# Patient Record
Sex: Male | Born: 1990 | Race: White | Hispanic: No | Marital: Single | Smoking: Former smoker
Health system: Southern US, Community
[De-identification: ages and names within clinical notes are randomized; demographics above are authoritative.]

## PROBLEM LIST (undated history)

## (undated) DIAGNOSIS — R569 Unspecified convulsions: Secondary | ICD-10-CM

## (undated) DIAGNOSIS — Z87828 Personal history of other (healed) physical injury and trauma: Secondary | ICD-10-CM

## (undated) HISTORY — PX: CRANIECTOMY FOR DEPRESSED SKULL FRACTURE: SHX5788

---

## 2015-05-27 ENCOUNTER — Emergency Department (HOSPITAL_COMMUNITY): Payer: Self-pay

## 2015-05-27 ENCOUNTER — Emergency Department (HOSPITAL_COMMUNITY)
Admission: EM | Admit: 2015-05-27 | Discharge: 2015-05-27 | Disposition: A | Discharge status: Home / Self Care | Payer: Self-pay | Attending: Emergency Medicine | Admitting: Emergency Medicine

## 2015-05-27 ENCOUNTER — Encounter (HOSPITAL_COMMUNITY): Payer: Self-pay | Admitting: *Deleted

## 2015-05-27 DIAGNOSIS — R569 Unspecified convulsions: Secondary | ICD-10-CM

## 2015-05-27 DIAGNOSIS — G40909 Epilepsy, unspecified, not intractable, without status epilepticus: Secondary | ICD-10-CM | POA: Insufficient documentation

## 2015-05-27 DIAGNOSIS — Z7982 Long term (current) use of aspirin: Secondary | ICD-10-CM | POA: Insufficient documentation

## 2015-05-27 DIAGNOSIS — Z3202 Encounter for pregnancy test, result negative: Secondary | ICD-10-CM | POA: Insufficient documentation

## 2015-05-27 DIAGNOSIS — Z87891 Personal history of nicotine dependence: Secondary | ICD-10-CM | POA: Insufficient documentation

## 2015-05-27 DIAGNOSIS — Z880 Allergy status to penicillin: Secondary | ICD-10-CM | POA: Insufficient documentation

## 2015-05-27 HISTORY — DX: Unspecified convulsions: R56.9

## 2015-05-27 LAB — CBC WITH DIFFERENTIAL/PLATELET
BASOS ABS: 0 10*3/uL (ref 0.0–0.1)
Basophils Relative: 0 % (ref 0–1)
EOS ABS: 0.1 10*3/uL (ref 0.0–0.7)
EOS PCT: 1 % (ref 0–5)
HEMATOCRIT: 35.1 % — AB (ref 36.0–46.0)
HEMOGLOBIN: 11.8 g/dL — AB (ref 12.0–15.0)
Lymphocytes Relative: 20 % (ref 12–46)
Lymphs Abs: 1.2 10*3/uL (ref 0.7–4.0)
MCH: 30.3 pg (ref 26.0–34.0)
MCHC: 33.6 g/dL (ref 30.0–36.0)
MCV: 90 fL (ref 78.0–100.0)
MONO ABS: 0.4 10*3/uL (ref 0.1–1.0)
MONOS PCT: 7 % (ref 3–12)
NEUTROS PCT: 72 % (ref 43–77)
Neutro Abs: 4.3 10*3/uL (ref 1.7–7.7)
Platelets: 133 10*3/uL — ABNORMAL LOW (ref 150–400)
RBC: 3.9 MIL/uL (ref 3.87–5.11)
RDW: 12.4 % (ref 11.5–15.5)
WBC: 5.9 10*3/uL (ref 4.0–10.5)

## 2015-05-27 LAB — BASIC METABOLIC PANEL
Anion gap: 6 (ref 5–15)
BUN: 7 mg/dL (ref 6–20)
CALCIUM: 9.1 mg/dL (ref 8.9–10.3)
CO2: 25 mmol/L (ref 22–32)
CREATININE: 0.67 mg/dL (ref 0.44–1.00)
Chloride: 107 mmol/L (ref 101–111)
GFR calc non Af Amer: >60 mL/min (ref >60)
GLUCOSE: 66 mg/dL (ref 65–99)
Potassium: 3.7 mmol/L (ref 3.5–5.1)
Sodium: 138 mmol/L (ref 135–145)

## 2015-05-27 LAB — URINALYSIS, ROUTINE W REFLEX MICROSCOPIC
Bilirubin Urine: NEGATIVE
GLUCOSE, UA: NEGATIVE mg/dL
KETONES UR: NEGATIVE mg/dL
NITRITE: NEGATIVE
PH: 6 (ref 5.0–8.0)
Protein, ur: NEGATIVE mg/dL
SPECIFIC GRAVITY, URINE: 1.029 (ref 1.005–1.030)
Urobilinogen, UA: 0.2 mg/dL (ref 0.0–1.0)

## 2015-05-27 LAB — URINE MICROSCOPIC-ADD ON

## 2015-05-27 LAB — PREGNANCY, URINE: Preg Test, Ur: NEGATIVE

## 2015-05-27 MED ORDER — CARBAMAZEPINE 200 MG PO TABS
200.0000 mg | ORAL_TABLET | Freq: Once | ORAL | Status: AC
  Administered 2015-05-27: 200 mg via ORAL
  Filled 2015-05-27: qty 1

## 2015-05-27 MED ORDER — LEVETIRACETAM 500 MG PO TABS: 500.0000 mg | ORAL_TABLET | Freq: Two times a day (BID) | ORAL | Status: DC

## 2015-05-27 MED ORDER — ACETAMINOPHEN 325 MG PO TABS
650.0000 mg | ORAL_TABLET | Freq: Once | ORAL | Status: AC
  Administered 2015-05-27: 650 mg via ORAL
  Filled 2015-05-27: qty 2

## 2015-05-27 MED ORDER — CARBAMAZEPINE 200 MG PO TABS: ORAL_TABLET | ORAL | Status: AC

## 2015-05-27 MED ORDER — LEVETIRACETAM 500 MG PO TABS
500.0000 mg | ORAL_TABLET | Freq: Once | ORAL | Status: DC
  Filled 2015-05-27: qty 1
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
## 2015-05-27 NOTE — Discharge Instructions (Signed)
You were evaluated in the ED today for your seizures. Your labs, exam and CT scan were all reassuring. It is important for you to follow-up with neurology for reevaluation for this problem. It is also important that you do not drive cars, take baths or swimming pool, cook alone or do any activities alone that could cause harm if you do have a seizure. Return to ED for worsening symptoms.  Seizure, Adult A seizure is abnormal electrical activity in the brain. Seizures usually last from 30 seconds to 2 minutes. There are various types of seizures. Before a seizure, you may have a warning sensation (aura) that a seizure is about to occur. An aura may include the following symptoms:   Fear or anxiety.  Nausea.  Feeling like the room is spinning (vertigo).  Vision changes, such as seeing flashing lights or spots. Common symptoms during a seizure include:  A change in attention or behavior (altered mental status).  Convulsions with rhythmic jerking movements.  Drooling.  Rapid eye movements.  Grunting.  Loss of bladder and bowel control.  Bitter taste in the mouth.  Tongue biting. After a seizure, you may feel confused and sleepy. You may also have an injury resulting from convulsions during the seizure. HOME CARE INSTRUCTIONS   If you are given medicines, take them exactly as prescribed by your health care provider.  Keep all follow-up appointments as directed by your health care provider.  Do not swim or drive or engage in risky activity during which a seizure could cause further injury to you or others until your health care provider says it is OK.  Get adequate rest.  Teach friends and family what to do if you have a seizure. They should:  Lay you on the ground to prevent a fall.  Put a cushion under your head.  Loosen any tight clothing around your neck.  Turn you on your side. If vomiting occurs, this helps keep your airway clear.  Stay with you until you  recover.  Know whether or not you need emergency care. SEEK IMMEDIATE MEDICAL CARE IF:  The seizure lasts longer than 5 minutes.  The seizure is severe or you do not wake up immediately after the seizure.  You have an altered mental status after the seizure.  You are having more frequent or worsening seizures. Someone should drive you to the emergency department or call local emergency services (911 in [place] MAKE SURE YOU:  Understand these instructions.  Will watch your condition.  Will get help right away if you are not doing well or get worse. Document Released: 09/10/2000 Document Revised: 07/04/2013 Document Reviewed: 04/25/2013 [facility] Patient Information 2015 [place], [place]. This information is not intended to replace advice given to you by your health care provider. Make sure you discuss any questions you have with your health care provider.

## 2015-09-26 ENCOUNTER — Emergency Department (HOSPITAL_COMMUNITY)
Admission: EM | Admit: 2015-09-26 | Discharge: 2015-09-26 | Disposition: A | Discharge status: Home / Self Care | Payer: Self-pay | Attending: Emergency Medicine | Admitting: Emergency Medicine

## 2015-09-26 ENCOUNTER — Emergency Department (HOSPITAL_COMMUNITY): Payer: Self-pay

## 2015-09-26 ENCOUNTER — Encounter (HOSPITAL_COMMUNITY): Payer: Self-pay | Admitting: Emergency Medicine

## 2015-09-26 DIAGNOSIS — R569 Unspecified convulsions: Secondary | ICD-10-CM

## 2015-09-26 DIAGNOSIS — R51 Headache: Secondary | ICD-10-CM | POA: Insufficient documentation

## 2015-09-26 DIAGNOSIS — Z880 Allergy status to penicillin: Secondary | ICD-10-CM | POA: Insufficient documentation

## 2015-09-26 DIAGNOSIS — Z3202 Encounter for pregnancy test, result negative: Secondary | ICD-10-CM | POA: Insufficient documentation

## 2015-09-26 DIAGNOSIS — F1210 Cannabis abuse, uncomplicated: Secondary | ICD-10-CM | POA: Insufficient documentation

## 2015-09-26 DIAGNOSIS — Z7982 Long term (current) use of aspirin: Secondary | ICD-10-CM | POA: Insufficient documentation

## 2015-09-26 DIAGNOSIS — Z79899 Other long term (current) drug therapy: Secondary | ICD-10-CM | POA: Insufficient documentation

## 2015-09-26 DIAGNOSIS — F1910 Other psychoactive substance abuse, uncomplicated: Secondary | ICD-10-CM | POA: Insufficient documentation

## 2015-09-26 DIAGNOSIS — Z87891 Personal history of nicotine dependence: Secondary | ICD-10-CM | POA: Insufficient documentation

## 2015-09-26 DIAGNOSIS — Z87828 Personal history of other (healed) physical injury and trauma: Secondary | ICD-10-CM | POA: Insufficient documentation

## 2015-09-26 DIAGNOSIS — G40901 Epilepsy, unspecified, not intractable, with status epilepticus: Secondary | ICD-10-CM | POA: Insufficient documentation

## 2015-09-26 LAB — CBC WITH DIFFERENTIAL/PLATELET
BASOS ABS: 0 10*3/uL (ref 0.0–0.1)
Basophils Relative: 0 %
EOS PCT: 1 %
Eosinophils Absolute: 0.1 10*3/uL (ref 0.0–0.7)
HCT: 40.5 % (ref 36.0–46.0)
Hemoglobin: 13.9 g/dL (ref 12.0–15.0)
Lymphocytes Relative: 17 %
Lymphs Abs: 1.3 10*3/uL (ref 0.7–4.0)
MCH: 30.6 pg (ref 26.0–34.0)
MCHC: 34.3 g/dL (ref 30.0–36.0)
MCV: 89.2 fL (ref 78.0–100.0)
MONO ABS: 0.5 10*3/uL (ref 0.1–1.0)
MONOS PCT: 7 %
NEUTROS ABS: 5.8 10*3/uL (ref 1.7–7.7)
NEUTROS PCT: 75 %
PLATELETS: 198 10*3/uL (ref 150–400)
RBC: 4.54 MIL/uL (ref 3.87–5.11)
RDW: 12.2 % (ref 11.5–15.5)
WBC: 7.7 10*3/uL (ref 4.0–10.5)

## 2015-09-26 LAB — RAPID URINE DRUG SCREEN, HOSP PERFORMED
Amphetamines: NOT DETECTED
BARBITURATES: NOT DETECTED
Benzodiazepines: POSITIVE — AB
Cocaine: NOT DETECTED
Opiates: NOT DETECTED
Tetrahydrocannabinol: POSITIVE — AB

## 2015-09-26 LAB — URINALYSIS, ROUTINE W REFLEX MICROSCOPIC
BILIRUBIN URINE: NEGATIVE
GLUCOSE, UA: NEGATIVE mg/dL
HGB URINE DIPSTICK: NEGATIVE
KETONES UR: NEGATIVE mg/dL
Nitrite: NEGATIVE
PH: 7 (ref 5.0–8.0)
PROTEIN: NEGATIVE mg/dL
Specific Gravity, Urine: 1.006 (ref 1.005–1.030)

## 2015-09-26 LAB — BASIC METABOLIC PANEL
ANION GAP: 9 (ref 5–15)
CALCIUM: 9.3 mg/dL (ref 8.9–10.3)
CO2: 26 mmol/L (ref 22–32)
CREATININE: 0.58 mg/dL (ref 0.44–1.00)
Chloride: 105 mmol/L (ref 101–111)
GFR calc Af Amer: >60 mL/min (ref >60)
GLUCOSE: 92 mg/dL (ref 65–99)
Potassium: 3.9 mmol/L (ref 3.5–5.1)
Sodium: 140 mmol/L (ref 135–145)

## 2015-09-26 LAB — URINE MICROSCOPIC-ADD ON: RBC / HPF: NONE SEEN RBC/hpf (ref 0–5)

## 2015-09-26 LAB — I-STAT BETA HCG BLOOD, ED (MC, WL, AP ONLY)

## 2015-09-26 LAB — CBG MONITORING, ED: Glucose-Capillary: 89 mg/dL (ref 65–99)

## 2015-09-26 LAB — CARBAMAZEPINE LEVEL, TOTAL

## 2015-09-26 MED ORDER — LORAZEPAM 2 MG/ML IJ SOLN
INTRAMUSCULAR | Status: AC
  Filled 2015-09-26: qty 1

## 2015-09-26 MED ORDER — LEVETIRACETAM 500 MG/5ML IV SOLN
1000.0000 mg | Freq: Once | INTRAVENOUS | Status: AC
  Administered 2015-09-26: 1000 mg via INTRAVENOUS
  Filled 2015-09-26: qty 10

## 2015-09-26 MED ORDER — LORAZEPAM 2 MG/ML IJ SOLN
1.0000 mg | Freq: Once | INTRAMUSCULAR | Status: AC
  Administered 2015-09-26: 1 mg via INTRAVENOUS

## 2015-09-26 MED ORDER — LORAZEPAM 2 MG/ML IJ SOLN
2.0000 mg | Freq: Once | INTRAMUSCULAR | Status: AC
  Administered 2015-09-26: 2 mg via INTRAVENOUS
  Filled 2015-09-26: qty 1

## 2015-09-26 MED ORDER — LEVETIRACETAM 500 MG PO TABS: 500.0000 mg | ORAL_TABLET | Freq: Two times a day (BID) | ORAL | Status: AC

## 2015-09-26 MED ORDER — SODIUM CHLORIDE 0.9 % IV BOLUS (SEPSIS)
1000.0000 mL | Freq: Once | INTRAVENOUS | Status: AC
  Administered 2015-09-26: 1000 mL via INTRAVENOUS

## 2015-09-26 NOTE — ED Notes (Signed)
Patient left at this time with all belongings. 

## 2015-09-26 NOTE — ED Notes (Signed)
Dr. Nanavati MD at bedside. 
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
## 2015-09-26 NOTE — ED Notes (Signed)
MD at bedside. 

## 2015-09-26 NOTE — ED Notes (Signed)
Pt arrives from home via [facility].  EMS reports pt seizing intermittently since 1215, hx of seizures.  EMS reports pt not taking rx meds.  Resp e/u.

## 2015-09-26 NOTE — ED Notes (Signed)
CBG 89 

## 2015-09-26 NOTE — Discharge Instructions (Signed)
Please see a Neurologist as soon as possible. Until then - please take the tegretol and the keppra. When you see the Neurologist, see if you need to continue on both or stop one of them.  Return to the ER if the symptoms return.   Seizure, Adult A seizure is abnormal electrical activity in the brain. Seizures usually last from 30 seconds to 2 minutes. There are various types of seizures. Before a seizure, you may have a warning sensation (aura) that a seizure is about to occur. An aura may include the following symptoms:   Fear or anxiety.  Nausea.  Feeling like the room is spinning (vertigo).  Vision changes, such as seeing flashing lights or spots. Common symptoms during a seizure include:  A change in attention or behavior (altered mental status).  Convulsions with rhythmic jerking movements.  Drooling.  Rapid eye movements.  Grunting.  Loss of bladder and bowel control.  Bitter taste in the mouth.  Tongue biting. After a seizure, you may feel confused and sleepy. You may also have an injury resulting from convulsions during the seizure. HOME CARE INSTRUCTIONS   If you are given medicines, take them exactly as prescribed by your health care provider.  Keep all follow-up appointments as directed by your health care provider.  Do not swim or drive or engage in risky activity during which a seizure could cause further injury to you or others until your health care provider says it is OK.  Get adequate rest.  Teach friends and family what to do if you have a seizure. They should:  Lay you on the ground to prevent a fall.  Put a cushion under your head.  Loosen any tight clothing around your neck.  Turn you on your side. If vomiting occurs, this helps keep your airway clear.  Stay with you until you recover.  Know whether or not you need emergency care. SEEK IMMEDIATE MEDICAL CARE IF:  The seizure lasts longer than 5 minutes.  The seizure is severe or you  do not wake up immediately after the seizure.  You have an altered mental status after the seizure.  You are having more frequent or worsening seizures. Someone should drive you to the emergency department or call local emergency services (911 in [place] MAKE SURE YOU:  Understand these instructions.  Will watch your condition.  Will get help right away if you are not doing well or get worse.   This information is not intended to replace advice given to you by your health care provider. Make sure you discuss any questions you have with your health care provider.   Document Released: 09/10/2000 Document Revised: 10/04/2014 Document Reviewed: 04/25/2013 Elsevier Interactive Patient Education [employer]2016 [employer]

## 2015-09-26 NOTE — ED Notes (Signed)
Pt gives permission to discuss all med info in front of Tiffany, fiancee

## 2015-09-27 ENCOUNTER — Observation Stay (HOSPITAL_COMMUNITY)
Admission: EM | Admit: 2015-09-27 | Discharge: 2015-09-27 | Discharge status: Against Medical Advice | Payer: Self-pay | Attending: Internal Medicine | Admitting: Internal Medicine

## 2015-09-27 ENCOUNTER — Emergency Department (HOSPITAL_COMMUNITY): Payer: Self-pay

## 2015-09-27 ENCOUNTER — Encounter (HOSPITAL_COMMUNITY): Payer: Self-pay | Admitting: *Deleted

## 2015-09-27 DIAGNOSIS — Z79899 Other long term (current) drug therapy: Secondary | ICD-10-CM | POA: Insufficient documentation

## 2015-09-27 DIAGNOSIS — Z87891 Personal history of nicotine dependence: Secondary | ICD-10-CM | POA: Insufficient documentation

## 2015-09-27 DIAGNOSIS — Z87820 Personal history of traumatic brain injury: Secondary | ICD-10-CM | POA: Insufficient documentation

## 2015-09-27 DIAGNOSIS — R569 Unspecified convulsions: Principal | ICD-10-CM

## 2015-09-27 DIAGNOSIS — Z9114 Patient's other noncompliance with medication regimen: Secondary | ICD-10-CM | POA: Insufficient documentation

## 2015-09-27 HISTORY — DX: Personal history of other (healed) physical injury and trauma: Z87.828

## 2015-09-27 LAB — COMPREHENSIVE METABOLIC PANEL
ALBUMIN: 3.4 g/dL — AB (ref 3.5–5.0)
ALK PHOS: 49 U/L (ref 38–126)
ALT: 10 U/L — ABNORMAL LOW (ref 14–54)
ANION GAP: 9 (ref 5–15)
AST: 13 U/L — AB (ref 15–41)
BUN: <5 mg/dL — ABNORMAL LOW (ref 6–20)
CALCIUM: 8.4 mg/dL — AB (ref 8.9–10.3)
CO2: 24 mmol/L (ref 22–32)
Chloride: 108 mmol/L (ref 101–111)
Creatinine, Ser: 0.51 mg/dL (ref 0.44–1.00)
GFR calc Af Amer: >60 mL/min (ref >60)
GFR calc non Af Amer: >60 mL/min (ref >60)
GLUCOSE: 89 mg/dL (ref 65–99)
Potassium: 3.7 mmol/L (ref 3.5–5.1)
SODIUM: 141 mmol/L (ref 135–145)
Total Bilirubin: 0.5 mg/dL (ref 0.3–1.2)
Total Protein: 5.7 g/dL — ABNORMAL LOW (ref 6.5–8.1)

## 2015-09-27 LAB — CBC
HEMATOCRIT: 37.2 % (ref 36.0–46.0)
HEMOGLOBIN: 12.2 g/dL (ref 12.0–15.0)
MCH: 29.5 pg (ref 26.0–34.0)
MCHC: 32.8 g/dL (ref 30.0–36.0)
MCV: 90.1 fL (ref 78.0–100.0)
Platelets: 147 10*3/uL — ABNORMAL LOW (ref 150–400)
RBC: 4.13 MIL/uL (ref 3.87–5.11)
RDW: 12.2 % (ref 11.5–15.5)
WBC: 5.9 10*3/uL (ref 4.0–10.5)

## 2015-09-27 LAB — PROTIME-INR
INR: 1.1 (ref 0.00–1.49)
Prothrombin Time: 14.4 seconds (ref 11.6–15.2)

## 2015-09-27 LAB — CBG MONITORING, ED: GLUCOSE-CAPILLARY: 80 mg/dL (ref 65–99)

## 2015-09-27 MED ORDER — SODIUM CHLORIDE 0.9 % IV SOLN
1000.0000 mg | Freq: Once | INTRAVENOUS | Status: AC
  Administered 2015-09-27: 1000 mg via INTRAVENOUS
  Filled 2015-09-27: qty 10

## 2015-09-27 MED ORDER — SODIUM CHLORIDE 0.9 % IJ SOLN
3.0000 mL | Freq: Two times a day (BID) | INTRAMUSCULAR | Status: DC
Start: 2015-09-27 — End: 2015-09-27
  Administered 2015-09-27 (×2): 3 mL via INTRAVENOUS

## 2015-09-27 MED ORDER — HEPARIN SODIUM (PORCINE) 5000 UNIT/ML IJ SOLN
5000.0000 [IU] | Freq: Three times a day (TID) | INTRAMUSCULAR | Status: DC
  Administered 2015-09-27: 5000 [IU] via SUBCUTANEOUS
  Filled 2015-09-27: qty 1

## 2015-09-27 MED ORDER — LEVETIRACETAM 500 MG PO TABS
500.0000 mg | ORAL_TABLET | Freq: Two times a day (BID) | ORAL | Status: DC
  Administered 2015-09-27: 500 mg via ORAL
  Filled 2015-09-27 (×2): qty 1

## 2015-09-27 MED ORDER — ACETAMINOPHEN 325 MG PO TABS
650.0000 mg | ORAL_TABLET | Freq: Four times a day (QID) | ORAL | Status: DC | PRN
  Administered 2015-09-27: 650 mg via ORAL
  Filled 2015-09-27: qty 2

## 2015-09-27 MED ORDER — VALPROATE SODIUM 500 MG/5ML IV SOLN: 1500.0000 mg | Freq: Once | INTRAVENOUS | Status: DC

## 2015-09-27 MED ORDER — SODIUM CHLORIDE 0.9 % IV SOLN
INTRAVENOUS | Status: DC
  Administered 2015-09-27: 04:00:00 via INTRAVENOUS

## 2015-09-27 MED ORDER — CARBAMAZEPINE 200 MG PO TABS
200.0000 mg | ORAL_TABLET | Freq: Two times a day (BID) | ORAL | Status: DC
  Administered 2015-09-27: 200 mg via ORAL
  Filled 2015-09-27: qty 1

## 2015-09-27 MED ORDER — ACETAMINOPHEN 650 MG RE SUPP
650.0000 mg | Freq: Four times a day (QID) | RECTAL | Status: DC | PRN
Start: 2015-09-27 — End: 2015-09-27

## 2015-09-27 MED ORDER — ONDANSETRON HCL 4 MG/2ML IJ SOLN: 4.0000 mg | Freq: Four times a day (QID) | INTRAMUSCULAR | Status: DC | PRN

## 2015-09-27 MED ORDER — ONDANSETRON HCL 4 MG PO TABS: 4.0000 mg | ORAL_TABLET | Freq: Four times a day (QID) | ORAL | Status: DC | PRN
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
## 2015-09-27 NOTE — ED Notes (Addendum)
Dr Niu at bedside 

## 2015-09-27 NOTE — ED Notes (Signed)
Pt having posturing lasting approximately 20 seconds, worse on L side than R. VSS

## 2015-09-27 NOTE — Procedures (Signed)
History: 24 yo F with h/o seizures presenting with abnormal spells.   Sedation: none  Technique: This is a 21 channel routine scalp EEG performed at the bedside with bipolar and monopolar montages arranged in accordance to the international 10/20 system of electrode placement. One channel was dedicated to EKG recording.    Background: The background consists of intermixed alpha and beta activities. There is an excess of beta activity. There is a well defined posterior dominant rhythm of 11 Hz that attenuates with eye opening. Sleep is recorded with normal appearing structures.   Photic stimulation: Physiologic driving is not performed  EEG Abnormalities: None  Clinical Interpretation: This normal EEG is recorded in the waking and sleep state. There was no seizure or seizure predisposition recorded on this study. Please note that a normal EEG does not preclude the possibility of epilepsy.   Ritta SlotMcNeill Kirkpatrick, MD [facility] [phone]  If 7pm- 7am, please page neurology on call as listed in AMION.
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
## 2015-09-27 NOTE — Progress Notes (Signed)
Stat EEG completed, results pending.  Dr. Kirkpatrick aware. 

## 2015-09-27 NOTE — ED Notes (Signed)
Pt to ED from home via [facility] c/o seizures at home.Pt was discharged tonight for same. EMS gave 4mg  ativan for seizures enroute which was effective. Pt began seizing on arrival to room. Dr.Pollina at bedside. Pt answering questions appropriately at this time c/o body aches

## 2015-09-27 NOTE — ED Notes (Signed)
Dr. Kirkpatrick at bedside 

## 2015-09-27 NOTE — ED Notes (Signed)
This RN called to bedside by pt and pt family. Pt. Stating that she wants to sign herself out AMA. Pt. Aware of risk and benefits, pt. AxO x4. Pt. Independently ambulatory with steady gait and no complaints of pain or dizziness. Pt. Family aware of pt. Wanting to leave and present at bedside. Admitting MD paged.

## 2015-09-27 NOTE — ED Notes (Signed)
Notified by pt. Family member that pt. Was more awake and wanting to leave. Family requesting MD to bedside to speak with pt., paged admitting MD to notify. Updated family that MD would round on pt. Today. At this time pt is refusing to be on monitor but is resting comfortably and cooperative in bed. Will continue to monitor pt.
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
## 2015-09-27 NOTE — ED Notes (Signed)
EEG in process at bedside °

## 2015-09-27 NOTE — ED Notes (Signed)
Pt lethargic, able to follow some commands. Mother at bedside

## 2015-09-27 NOTE — ED Notes (Signed)
Pt having full body convulsions, hr 125, oxygen sats remain 95%. No incontinence or oral trauma. Episode lasted ~45seconds then resolved. Dr.Pollina aware

## 2015-09-27 NOTE — ED Notes (Signed)
This RN spoke directly with Dr Gwenlyn PerkingMadera regarding pt. Wish to sign out AMA. Pt. Informed of physician concerns regarding medication/prescriptions and follow up care. Pt. Aware of concerns and is not willing to wait for physician to come assess pt. and discuss admission with pt. Pt. Ambulatory, AxO x4.
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
## 2015-11-19 ENCOUNTER — Emergency Department (HOSPITAL_COMMUNITY)
Admission: EM | Admit: 2015-11-19 | Discharge: 2015-11-19 | Disposition: A | Discharge status: Home / Self Care | Payer: Self-pay | Attending: Emergency Medicine | Admitting: Emergency Medicine

## 2015-11-19 ENCOUNTER — Emergency Department (HOSPITAL_COMMUNITY): Payer: Self-pay

## 2015-11-19 ENCOUNTER — Encounter (HOSPITAL_COMMUNITY): Payer: Self-pay | Admitting: Vascular Surgery

## 2015-11-19 DIAGNOSIS — Y998 Other external cause status: Secondary | ICD-10-CM | POA: Insufficient documentation

## 2015-11-19 DIAGNOSIS — S3992XA Unspecified injury of lower back, initial encounter: Secondary | ICD-10-CM | POA: Insufficient documentation

## 2015-11-19 DIAGNOSIS — W19XXXA Unspecified fall, initial encounter: Secondary | ICD-10-CM

## 2015-11-19 DIAGNOSIS — M545 Low back pain, unspecified: Secondary | ICD-10-CM

## 2015-11-19 DIAGNOSIS — S29001A Unspecified injury of muscle and tendon of front wall of thorax, initial encounter: Secondary | ICD-10-CM | POA: Insufficient documentation

## 2015-11-19 DIAGNOSIS — S4992XA Unspecified injury of left shoulder and upper arm, initial encounter: Secondary | ICD-10-CM | POA: Insufficient documentation

## 2015-11-19 DIAGNOSIS — S29002A Unspecified injury of muscle and tendon of back wall of thorax, initial encounter: Secondary | ICD-10-CM | POA: Insufficient documentation

## 2015-11-19 DIAGNOSIS — M25512 Pain in left shoulder: Secondary | ICD-10-CM

## 2015-11-19 DIAGNOSIS — Z87891 Personal history of nicotine dependence: Secondary | ICD-10-CM | POA: Insufficient documentation

## 2015-11-19 DIAGNOSIS — Y9389 Activity, other specified: Secondary | ICD-10-CM | POA: Insufficient documentation

## 2015-11-19 DIAGNOSIS — R200 Anesthesia of skin: Secondary | ICD-10-CM | POA: Insufficient documentation

## 2015-11-19 DIAGNOSIS — R569 Unspecified convulsions: Secondary | ICD-10-CM | POA: Insufficient documentation

## 2015-11-19 DIAGNOSIS — S79912A Unspecified injury of left hip, initial encounter: Secondary | ICD-10-CM | POA: Insufficient documentation

## 2015-11-19 DIAGNOSIS — S79911A Unspecified injury of right hip, initial encounter: Secondary | ICD-10-CM | POA: Insufficient documentation

## 2015-11-19 DIAGNOSIS — Y9289 Other specified places as the place of occurrence of the external cause: Secondary | ICD-10-CM | POA: Insufficient documentation

## 2015-11-19 DIAGNOSIS — Z79899 Other long term (current) drug therapy: Secondary | ICD-10-CM | POA: Insufficient documentation

## 2015-11-19 DIAGNOSIS — S3991XA Unspecified injury of abdomen, initial encounter: Secondary | ICD-10-CM | POA: Insufficient documentation

## 2015-11-19 DIAGNOSIS — Z3202 Encounter for pregnancy test, result negative: Secondary | ICD-10-CM | POA: Insufficient documentation

## 2015-11-19 DIAGNOSIS — W1789XA Other fall from one level to another, initial encounter: Secondary | ICD-10-CM | POA: Insufficient documentation

## 2015-11-19 DIAGNOSIS — R55 Syncope and collapse: Secondary | ICD-10-CM | POA: Insufficient documentation

## 2015-11-19 LAB — COMPREHENSIVE METABOLIC PANEL
ALT: 12 U/L — ABNORMAL LOW (ref 14–54)
AST: 14 U/L — ABNORMAL LOW (ref 15–41)
Albumin: 4.6 g/dL (ref 3.5–5.0)
Alkaline Phosphatase: 53 U/L (ref 38–126)
Anion gap: 13 (ref 5–15)
BUN: 9 mg/dL (ref 6–20)
CHLORIDE: 103 mmol/L (ref 101–111)
CO2: 22 mmol/L (ref 22–32)
CREATININE: 0.72 mg/dL (ref 0.44–1.00)
Calcium: 10.1 mg/dL (ref 8.9–10.3)
Glucose, Bld: 85 mg/dL (ref 65–99)
POTASSIUM: 4.3 mmol/L (ref 3.5–5.1)
Sodium: 138 mmol/L (ref 135–145)
Total Bilirubin: 0.1 mg/dL — ABNORMAL LOW (ref 0.3–1.2)
Total Protein: 7.6 g/dL (ref 6.5–8.1)

## 2015-11-19 LAB — I-STAT CHEM 8, ED
BUN: 10 mg/dL (ref 6–20)
Calcium, Ion: 1.19 mmol/L (ref 1.12–1.23)
Chloride: 100 mmol/L — ABNORMAL LOW (ref 101–111)
Creatinine, Ser: 0.7 mg/dL (ref 0.44–1.00)
Glucose, Bld: 82 mg/dL (ref 65–99)
HEMATOCRIT: 46 % (ref 36.0–46.0)
HEMOGLOBIN: 15.6 g/dL — AB (ref 12.0–15.0)
POTASSIUM: 4.2 mmol/L (ref 3.5–5.1)
Sodium: 139 mmol/L (ref 135–145)
TCO2: 25 mmol/L (ref 0–100)

## 2015-11-19 LAB — CBC
HEMATOCRIT: 41.9 % (ref 36.0–46.0)
Hemoglobin: 13.8 g/dL (ref 12.0–15.0)
MCH: 29.2 pg (ref 26.0–34.0)
MCHC: 32.9 g/dL (ref 30.0–36.0)
MCV: 88.8 fL (ref 78.0–100.0)
PLATELETS: 224 10*3/uL (ref 150–400)
RBC: 4.72 MIL/uL (ref 3.87–5.11)
RDW: 12 % (ref 11.5–15.5)
WBC: 8.3 10*3/uL (ref 4.0–10.5)

## 2015-11-19 LAB — ETHANOL: Alcohol, Ethyl (B): <5 mg/dL (ref <5)

## 2015-11-19 LAB — PROTIME-INR
INR: 1.01 (ref 0.00–1.49)
Prothrombin Time: 13.5 seconds (ref 11.6–15.2)

## 2015-11-19 LAB — CDS SEROLOGY

## 2015-11-19 LAB — I-STAT BETA HCG BLOOD, ED (MC, WL, AP ONLY)

## 2015-11-19 LAB — SAMPLE TO BLOOD BANK

## 2015-11-19 MED ORDER — SODIUM CHLORIDE 0.9 % IV SOLN
1000.0000 mg | Freq: Once | INTRAVENOUS | Status: AC
  Administered 2015-11-19: 1000 mg via INTRAVENOUS
  Filled 2015-11-19: qty 10

## 2015-11-19 MED ORDER — IOHEXOL 300 MG/ML  SOLN
100.0000 mL | Freq: Once | INTRAMUSCULAR | Status: AC | PRN
  Administered 2015-11-19: 100 mL via INTRAVENOUS

## 2015-11-19 MED ORDER — HYDROCODONE-ACETAMINOPHEN 5-325 MG PO TABS
1.0000 | ORAL_TABLET | Freq: Once | ORAL | Status: AC
  Administered 2015-11-19: 1 via ORAL
  Filled 2015-11-19: qty 1

## 2015-11-19 MED ORDER — ONDANSETRON HCL 4 MG/2ML IJ SOLN
4.0000 mg | Freq: Once | INTRAMUSCULAR | Status: AC
  Administered 2015-11-19: 4 mg via INTRAVENOUS
  Filled 2015-11-19: qty 2

## 2015-11-19 MED ORDER — HYDROMORPHONE HCL 1 MG/ML IJ SOLN
1.0000 mg | Freq: Once | INTRAMUSCULAR | Status: AC
  Administered 2015-11-19: 1 mg via INTRAVENOUS
  Filled 2015-11-19: qty 1

## 2015-11-19 MED ORDER — SODIUM CHLORIDE 0.9 % IV BOLUS (SEPSIS)
1000.0000 mL | Freq: Once | INTRAVENOUS | Status: AC
  Administered 2015-11-19: 1000 mL via INTRAVENOUS

## 2015-11-19 NOTE — ED Notes (Signed)
Patient transported to MRI 

## 2015-11-19 NOTE — ED Provider Notes (Signed)
CSN: [id number]     Arrival date & time 11/19/15  1320 History   First MD Initiated Contact with Patient 11/19/15 1323     Chief Complaint  Patient presents with  . Fall     (Consider location/radiation/quality/duration/timing/severity/associated sxs/prior Treatment) HPI Comments: 25 year old male with a history of seizure disorder, with question of possible psychogenic seizures as well in the past, presents with concern for fall 15 feet through attic floor, with concern for generalized seizure activity after fall, pain in the chest, shoulder and back, as well as numbness in the bilateral lower extremities. Pain severe on left side per pt.  10/10  Patient is a 25 y.o. male presenting with fall.  Fall This is a new problem. The current episode started less than 1 hour ago. The problem occurs constantly. The problem has not changed since onset.Associated symptoms include chest pain and abdominal pain. Pertinent negatives include no headaches and no shortness of breath. Nothing aggravates the symptoms. Nothing relieves the symptoms. She has tried nothing for the symptoms. The treatment provided no relief.    Past Medical History  Diagnosis Date  . Seizures (HCC)   . History of head injury    Past Surgical History  Procedure Laterality Date  . Craniectomy for depressed skull fracture     Family History  Problem Relation Age of Onset  . Alcohol abuse Father   . Cancer - Other Father     Possible liver cancer   Social History  Substance Use Topics  . Smoking status: Former Smoker    Quit date: 10/24/2013  . Smokeless tobacco: Current User    Types: Chew  . Alcohol Use: No   OB History    No data available     Review of Systems  Constitutional: Negative for fever.  HENT: Negative for sore throat.   Eyes: Negative for visual disturbance.  Respiratory: Negative for cough and shortness of breath.   Cardiovascular: Positive for chest pain. Negative for leg swelling.   Gastrointestinal: Positive for abdominal pain. Negative for nausea and vomiting.  Genitourinary: Negative for difficulty urinating.  Musculoskeletal: Positive for back pain. Negative for neck pain.  Skin: Negative for rash.  Neurological: Positive for seizures, syncope and numbness. Negative for facial asymmetry, weakness (pain with movemetn) and headaches.      Allergies  Ceclor and Penicillins  Home Medications   Prior to Admission medications   Medication Sig Start Date End Date Taking? Authorizing Provider  levETIRAcetam (KEPPRA) 500 MG tablet Take 1 tablet (500 mg total) by mouth 2 (two) times daily. 09/26/15  Yes Derwood Kaplan, MD  carbamazepine (TEGRETOL) 200 MG tablet 200mg  PO bid.. Total of 400mg  per day Patient taking differently: Take 200 mg by mouth 2 (two) times daily.  05/27/15   Joycie Peek, PA-C   BP 95/65 mmHg  Pulse 65  Temp(Src) 98.5 F (36.9 C) (Oral)  Resp 17  SpO2 100%  LMP 11/05/2015 Physical Exam  Constitutional: She is oriented to person, place, and time. She appears well-developed and well-nourished. No distress.  HENT:  Head: Normocephalic and atraumatic.  Eyes: Conjunctivae and EOM are normal.  Neck: Normal range of motion.  Cardiovascular: Normal rate, regular rhythm, normal heart sounds and intact distal pulses.  Exam reveals no gallop and no friction rub.   No murmur heard. Pulmonary/Chest: Effort normal and breath sounds normal. No respiratory distress. She has no wheezes. She has no rales. She exhibits tenderness (left chest).  Abdominal: Soft. She exhibits  no distension. There is tenderness (left abdomen). There is no guarding.  Musculoskeletal: She exhibits no edema.       Left shoulder: She exhibits decreased range of motion, tenderness and decreased strength (with pain). She exhibits normal pulse.       Right hip: She exhibits bony tenderness.       Left hip: She exhibits bony tenderness.       Cervical back: She exhibits no  tenderness and no bony tenderness.       Thoracic back: She exhibits tenderness and bony tenderness.       Lumbar back: She exhibits tenderness and bony tenderness.  Neurological: She is alert and oriented to person, place, and time. A sensory deficit (all distributions of lower extremity, reports  no feeling at all on initial exam) is present. GCS eye subscore is 4. GCS verbal subscore is 5. GCS motor subscore is 6.  Strength of LUE limited by pain in left shoulder RUE normal strength Bilateral lower extremities strength limited by pain, however able to flex/extend in all joints against resistance   Skin: Skin is warm and dry. No rash noted. She is not diaphoretic. No erythema.  Nursing note and vitals reviewed.   ED Course  Procedures (including critical care time) Labs Review Labs Reviewed  COMPREHENSIVE METABOLIC PANEL - Abnormal; Notable for the following:    AST 14 (*)    ALT 12 (*)    Total Bilirubin 0.1 (*)    All other components within normal limits  I-STAT CHEM 8, ED - Abnormal; Notable for the following:    Chloride 100 (*)    Hemoglobin 15.6 (*)    All other components within normal limits  CDS SEROLOGY  CBC  ETHANOL  PROTIME-INR  I-STAT BETA HCG BLOOD, ED ([facility], [facility], [facility] ONLY)  SAMPLE TO BLOOD BANK    Imaging Review Ct Head Wo Contrast  11/19/2015  CLINICAL DATA:  Fall through attic floor with loss of consciousness and posterior headaches EXAM: CT HEAD WITHOUT CONTRAST CT CERVICAL SPINE WITHOUT CONTRAST TECHNIQUE: Multidetector CT imaging of the head and cervical spine was performed following the standard protocol without intravenous contrast. Multiplanar CT image reconstructions of the cervical spine were also generated. COMPARISON:  None. FINDINGS: CT HEAD FINDINGS The bony calvarium is intact. No gross soft tissue abnormality is noted. No findings to suggest acute hemorrhage, acute infarction or space-occupying mass lesion are noted. CT CERVICAL SPINE FINDINGS Seven  cervical segments are well visualized. Vertebral body height is well maintained. No acute fracture or acute facet abnormality is noted. The surrounding soft tissues are within normal limits. The visualized lung apices are unremarkable. IMPRESSION: CT of the head:  No acute intracranial abnormality noted. CT of the cervical spine:  No acute abnormality noted. Electronically Signed   By: Alcide Clever M.D.   On: 11/19/2015 16:04   Ct Chest W Contrast  11/19/2015  CLINICAL DATA:  25 year old male fell through an attic today. Left rib and shoulder pain. Lumbar pain radiating down both lower extremities. Initial encounter. EXAM: CT CHEST, ABDOMEN, AND PELVIS WITH CONTRAST TECHNIQUE: Multidetector CT imaging of the chest, abdomen and pelvis was performed following the standard protocol during bolus administration of intravenous contrast. CONTRAST:  OMNIPAQUE IOHEXOL 300 MG/ML  SOLN COMPARISON:  Trauma series chest and pelvis radiographs from today reported separately. FINDINGS: CT CHEST Major airways are patent. Minor dependent atelectasis in both lungs. No pneumothorax. No pleural effusion. No pericardial effusion. Thoracic aorta and proximal great  vessels appear normal. No mediastinal hematoma, trace residual thymus. No thoracic lymphadenopathy. Negative thoracic inlet. No superficial chest wall injury identified. Left clavicle, scapula, and visualize left humerus appear intact. No displaced left rib fracture identified. Likewise, right rib and right shoulder structures appear intact. Sternum intact. There are multilevel mild thoracic superior endplate deformities (T4, T5, T11), but these appear chronic. No acute thoracic spine fracture identified. CT ABDOMEN AND PELVIS Normal lumbar segmentation. Normal lumbar vertebral height and alignment. No lumbar fracture identified. Sacrum and SI joints intact. Pelvis and proximal femurs intact. No pelvic free fluid. Negative uterus, at adnexa, urinary bladder, and  rectum. Intermittent retained stool and redundancy of the large bowel. There is trace right lower quadrant free fluid (coronal image 44) but normal adjacent cecum and appendix. Negative terminal ileum. No dilated or abnormal small bowel loops identified. Stomach and duodenum appear within normal limits. No abdominal free air. No upper abdominal free fluid. Liver, gallbladder, spleen, pancreas and adrenal glands are within normal limits. Portal venous system is patent. Major arterial structures in the abdomen and pelvis appear normal. Normal bilateral renal enhancement and contrast excretion. No lymphadenopathy. There is a 3 cm area of subcutaneous fat contusion along the right lateral body wall (series 21, image 75). No other superficial soft tissue injury identified. IMPRESSION: 1. No definite acute traumatic injury identified in the chest. Mild T4, T5, and T11 superior endplate deformities appear chronic. 2. Mild right lower abdominal wall contusion. Nonspecific trace right lower quadrant free fluid. 3. No other acute traumatic injury identified in the abdomen or pelvis. Electronically Signed   By: Odessa Fleming M.D.   On: 11/19/2015 16:12   Ct Cervical Spine Wo Contrast  11/19/2015  CLINICAL DATA:  Fall through attic floor with loss of consciousness and posterior headaches EXAM: CT HEAD WITHOUT CONTRAST CT CERVICAL SPINE WITHOUT CONTRAST TECHNIQUE: Multidetector CT imaging of the head and cervical spine was performed following the standard protocol without intravenous contrast. Multiplanar CT image reconstructions of the cervical spine were also generated. COMPARISON:  None. FINDINGS: CT HEAD FINDINGS The bony calvarium is intact. No gross soft tissue abnormality is noted. No findings to suggest acute hemorrhage, acute infarction or space-occupying mass lesion are noted. CT CERVICAL SPINE FINDINGS Seven cervical segments are well visualized. Vertebral body height is well maintained. No acute fracture or acute facet  abnormality is noted. The surrounding soft tissues are within normal limits. The visualized lung apices are unremarkable. IMPRESSION: CT of the head:  No acute intracranial abnormality noted. CT of the cervical spine:  No acute abnormality noted. Electronically Signed   By: Alcide Clever M.D.   On: 11/19/2015 16:04   Ct Abdomen Pelvis W Contrast  11/19/2015  CLINICAL DATA:  25 year old male fell through an attic today. Left rib and shoulder pain. Lumbar pain radiating down both lower extremities. Initial encounter. EXAM: CT CHEST, ABDOMEN, AND PELVIS WITH CONTRAST TECHNIQUE: Multidetector CT imaging of the chest, abdomen and pelvis was performed following the standard protocol during bolus administration of intravenous contrast. CONTRAST:  OMNIPAQUE IOHEXOL 300 MG/ML  SOLN COMPARISON:  Trauma series chest and pelvis radiographs from today reported separately. FINDINGS: CT CHEST Major airways are patent. Minor dependent atelectasis in both lungs. No pneumothorax. No pleural effusion. No pericardial effusion. Thoracic aorta and proximal great vessels appear normal. No mediastinal hematoma, trace residual thymus. No thoracic lymphadenopathy. Negative thoracic inlet. No superficial chest wall injury identified. Left clavicle, scapula, and visualize left humerus appear intact. No displaced left  rib fracture identified. Likewise, right rib and right shoulder structures appear intact. Sternum intact. There are multilevel mild thoracic superior endplate deformities (T4, T5, T11), but these appear chronic. No acute thoracic spine fracture identified. CT ABDOMEN AND PELVIS Normal lumbar segmentation. Normal lumbar vertebral height and alignment. No lumbar fracture identified. Sacrum and SI joints intact. Pelvis and proximal femurs intact. No pelvic free fluid. Negative uterus, at adnexa, urinary bladder, and rectum. Intermittent retained stool and redundancy of the large bowel. There is trace right lower quadrant  free fluid (coronal image 44) but normal adjacent cecum and appendix. Negative terminal ileum. No dilated or abnormal small bowel loops identified. Stomach and duodenum appear within normal limits. No abdominal free air. No upper abdominal free fluid. Liver, gallbladder, spleen, pancreas and adrenal glands are within normal limits. Portal venous system is patent. Major arterial structures in the abdomen and pelvis appear normal. Normal bilateral renal enhancement and contrast excretion. No lymphadenopathy. There is a 3 cm area of subcutaneous fat contusion along the right lateral body wall (series 21, image 75). No other superficial soft tissue injury identified. IMPRESSION: 1. No definite acute traumatic injury identified in the chest. Mild T4, T5, and T11 superior endplate deformities appear chronic. 2. Mild right lower abdominal wall contusion. Nonspecific trace right lower quadrant free fluid. 3. No other acute traumatic injury identified in the abdomen or pelvis. Electronically Signed   By: Odessa Fleming M.D.   On: 11/19/2015 16:12   Dg Pelvis Portable  11/19/2015  CLINICAL DATA:  Bilateral hip pain after falling out of her attic this afternoon. EXAM: PORTABLE PELVIS 1-2 VIEWS COMPARISON:  Pelvis CT dated 01/13/2015. FINDINGS: There is no evidence of pelvic fracture or diastasis. No pelvic bone lesions are seen. IMPRESSION: Normal examination. Electronically Signed   By: Beckie Salts M.D.   On: 11/19/2015 14:16   Dg Chest Portable 1 View  11/19/2015  CLINICAL DATA:  Larey Seat from a height. Chest pressure and shortness of breath. EXAM: PORTABLE CHEST 1 VIEW COMPARISON:  None. FINDINGS: Extensive artifact overlies the chest. Heart size is normal. Mediastinal shadows are normal. The lungs are clear. I think soft tissue shadow artifact overlies the right chest no pneumothorax or hemothorax. No bone abnormality seen. IMPRESSION: Overlying artifact.  No active or traumatic process suspected. Electronically Signed   By:  Paulina Fusi M.D.   On: 11/19/2015 14:15   Dg Shoulder Left  11/19/2015  CLINICAL DATA:  Fall from attic, left shoulder pain EXAM: LEFT SHOULDER - 2+ VIEW COMPARISON:  None. FINDINGS: No fracture or dislocation is seen. The joint spaces are preserved. Visualized soft tissues are within normal limits. Visualized left lung is clear. IMPRESSION: No fracture or dislocation is seen. Electronically Signed   By: Charline Bills M.D.   On: 11/19/2015 17:29   I have personally reviewed and evaluated these images and lab results as part of my medical decision-making.   EKG Interpretation   Date/Time:  Wednesday November 19 2015 13:44:07 EST Ventricular Rate:  67 PR Interval:  131 QRS Duration: 87 QT Interval:  415 QTC Calculation: 438 R Axis:   72 Text Interpretation:  Sinus rhythm Baseline wander in lead(s) V3 V4 No  significant change since last tracing Artifact Confirmed by [facility] MD,  Karrine Kluttz ([id number]) on 11/19/2015 5:37:44 PM      MDM   Final diagnoses:  Fall, 30ft through attic floor  Midline low back pain without sciatica  Numbness of both lower extremities  Left shoulder pain  25 year old male with a history of seizure disorder, with question of possible psychogenic seizures as well in the past, presents with concern for fall 15 feet through attic floor, with concern for generalized seizure activity after fall, pain in the chest, shoulder and back, as well as numbness in the bilateral lower extremities.  Given mechanism and neurologic deficits, full trauma scans were obtained showing no acute findings in head, cervical spine, chest abdomen and pelvis. There is a question of likely chronic endplate fractures in the thoracic spine. Pt initially given dilaudid for pain.  Patient with difficulty with strength testing in left upper arm, however states this is secondary to pain in the left shoulder and denies neck pain.  She continues to have numbness of the bilateral lower extremities,  and while slow moving with strength testing, is able to use force against resistence with hip flexion, knee flexion/extension, dorsi and plantar flexion.  Given persistent sensory deficit we will do MRI of the thoracic and lumbar spine. Discussed with patient that without evidence at this time of acute fractures or other injury, will not provide other narcotic medications.  Patient awaiting MRI and XR shoulder at time of transfer of care to Dr. Clarene Duke.    Alvira Monday, MD 11/19/15 2041

## 2015-11-19 NOTE — ED Notes (Signed)
Pt back from MRI 

## 2015-11-19 NOTE — ED Provider Notes (Signed)
I received this pt in signout from Dr. Dalene Seltzer, who was awaiting MRI lumbar spine and shoulder films after patient had a fall through an attic floor. Shoulder film normal. MRI showed no injury to spinal cord, possible superior endplate fracture of C7, however no loss of height or retropulsion. Patient was well-appearing and comfortable at time of discharge. I reviewed findings and discussed supportive care instructions. I also reviewed return precautions including any neurologic deficits or symptoms of cauda equina. Patient voiced understanding and was discharged in satisfactory condition.  Laurence Spates, MD 11/19/15 2204

## 2015-11-19 NOTE — ED Notes (Signed)
X-ray at bedside

## 2015-11-19 NOTE — ED Notes (Signed)
Pt reports to the ED following a fall from 15 ft from attic to a concrete floor. She arrives on the LSB, head blocks, with C-collar in place. Pt landed on her back on a toy box and then and dresser and then the floor. Pt then had a tonic clonic seizure. Pt complaining of head, neck, back, and left shoulder pain. Pt complaining of numbness in low back and down bilateral legs. Unknown LOC. Pt denies any tongue injury or incontinence. Denies any CP but reports some SOB. Pt A&Ox4, resp e/u, and skin warm and dry.

## 2023-04-12 DIAGNOSIS — K047 Periapical abscess without sinus: Secondary | ICD-10-CM | POA: Diagnosis not present

## 2023-04-12 DIAGNOSIS — Z880 Allergy status to penicillin: Secondary | ICD-10-CM | POA: Diagnosis not present

## 2023-04-12 DIAGNOSIS — R220 Localized swelling, mass and lump, head: Secondary | ICD-10-CM | POA: Diagnosis not present

## 2023-06-06 DIAGNOSIS — F649 Gender identity disorder, unspecified: Secondary | ICD-10-CM | POA: Diagnosis not present

## 2023-06-17 DIAGNOSIS — F649 Gender identity disorder, unspecified: Secondary | ICD-10-CM | POA: Diagnosis not present

## 2024-08-20 ENCOUNTER — Other Ambulatory Visit: Payer: Self-pay

## 2024-08-20 ENCOUNTER — Emergency Department: Payer: Self-pay

## 2024-08-20 ENCOUNTER — Emergency Department
Admission: EM | Admit: 2024-08-20 | Discharge: 2024-08-20 | Disposition: A | Discharge status: Home / Self Care | Payer: Self-pay | Attending: Emergency Medicine | Admitting: Emergency Medicine

## 2024-08-20 DIAGNOSIS — D72829 Elevated white blood cell count, unspecified: Secondary | ICD-10-CM | POA: Insufficient documentation

## 2024-08-20 DIAGNOSIS — N10 Acute tubulo-interstitial nephritis: Secondary | ICD-10-CM | POA: Insufficient documentation

## 2024-08-20 LAB — BASIC METABOLIC PANEL WITH GFR
Anion gap: 11 (ref 5–15)
BUN: 7 mg/dL (ref 6–20)
CO2: 24 mmol/L (ref 22–32)
Calcium: 9.1 mg/dL (ref 8.9–10.3)
Chloride: 103 mmol/L (ref 98–111)
Creatinine, Ser: 0.68 mg/dL (ref 0.61–1.24)
GFR, Estimated: >60 mL/min (ref >60)
Glucose, Bld: 105 mg/dL — ABNORMAL HIGH (ref 70–99)
Potassium: 4.2 mmol/L (ref 3.5–5.1)
Sodium: 137 mmol/L (ref 135–145)

## 2024-08-20 LAB — URINALYSIS, ROUTINE W REFLEX MICROSCOPIC
Bilirubin Urine: NEGATIVE
Glucose, UA: NEGATIVE mg/dL
Ketones, ur: NEGATIVE mg/dL
Nitrite: NEGATIVE
Protein, ur: 100 mg/dL — AB
RBC / HPF: >50 RBC/hpf (ref 0–5)
Specific Gravity, Urine: 1.011 (ref 1.005–1.030)
WBC, UA: >50 WBC/hpf (ref 0–5)
pH: 6 (ref 5.0–8.0)

## 2024-08-20 LAB — CBC
HCT: 42.1 % (ref 39.0–52.0)
Hemoglobin: 14 g/dL (ref 13.0–17.0)
MCH: 30.2 pg (ref 26.0–34.0)
MCHC: 33.3 g/dL (ref 30.0–36.0)
MCV: 90.7 fL (ref 80.0–100.0)
Platelets: 202 K/uL (ref 150–400)
RBC: 4.64 MIL/uL (ref 4.22–5.81)
RDW: 12 % (ref 11.5–15.5)
WBC: 14.9 K/uL — ABNORMAL HIGH (ref 4.0–10.5)
nRBC: 0 % (ref 0.0–0.2)

## 2024-08-20 LAB — POC URINE PREG, ED: Preg Test, Ur: NEGATIVE

## 2024-08-20 MED ORDER — LEVOFLOXACIN IN D5W 500 MG/100ML IV SOLN
500.0000 mg | Freq: Once | INTRAVENOUS | Status: AC
  Administered 2024-08-20: 500 mg via INTRAVENOUS
  Filled 2024-08-20: qty 100

## 2024-08-20 MED ORDER — LEVOFLOXACIN 750 MG PO TABS: 750.0000 mg | ORAL_TABLET | Freq: Every day | ORAL | 0 refills | Status: AC

## 2024-08-20 MED ORDER — IOHEXOL 300 MG/ML  SOLN
100.0000 mL | Freq: Once | INTRAMUSCULAR | Status: AC | PRN
  Administered 2024-08-20: 100 mL via INTRAVENOUS

## 2024-08-20 MED ORDER — OXYCODONE-ACETAMINOPHEN 5-325 MG PO TABS: 1.0000 | ORAL_TABLET | ORAL | 0 refills | Status: AC | PRN

## 2024-08-20 MED ORDER — SODIUM CHLORIDE 0.9 % IV BOLUS
1000.0000 mL | Freq: Once | INTRAVENOUS | Status: AC
  Administered 2024-08-20: 1000 mL via INTRAVENOUS

## 2024-08-20 NOTE — ED Triage Notes (Signed)
 Pt to ED via POV from home. Pt reports burning with urination over the past week with no relief with OTC medication. Pt reports is now having pain in mid back. Pt is on HRT hormone therapy.

## 2024-08-20 NOTE — Discharge Instructions (Signed)
 Take the antibiotic starting tomorrow.  You have already been given a dose here in the ER today. Pain medication as needed. Drink plenty of water. Return if fever, chills, vomiting or feel that you are getting worse.

## 2024-08-20 NOTE — ED Provider Notes (Signed)
 [facility] Provider Note    Event Date/Time   First MD Initiated Contact with Patient 08/20/24 1357     (approximate)   History   Dysuria   HPI  Tylyn Derwin is a 33 y.o. adult with history of HRT with testosterone presents emergency department complaining of burning with urination for 2 weeks.  Now having left kidney pain.  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  History of kidney stones.  States she used over-the-counter medication but only got worse.  Has been having chills and nausea but no actual vomiting.      Physical Exam   Triage Vital Signs: ED Triage Vitals [08/20/24 1221]  Encounter Vitals Group     BP 127/81     Girls Systolic BP Percentile      Girls Diastolic BP Percentile      Boys Systolic BP Percentile      Boys Diastolic BP Percentile      Pulse Rate (!) 107     Resp 18     Temp 98.1 F (36.7 C)     Temp Source Oral     SpO2 98 %     Weight      Height      Head Circumference      Peak Flow      Pain Score 9     Pain Loc      Pain Education      Exclude from Growth Chart     Most recent vital signs: Vitals:   08/20/24 1402 08/20/24 1622  BP:  121/87  Pulse:  82  Resp:  18  Temp:  98.7 F (37.1 C)  SpO2: 98% 99%     General: Awake, no distress.   CV:  Good peripheral perfusion. regular rate and  rhythm Resp:  Normal effort. Lungs cta Abd:  No distention.  Positive left-sided CVA tenderness Other:      ED Results / Procedures / Treatments   Labs (all labs ordered are listed, but only abnormal results are displayed) Labs Reviewed  URINALYSIS, ROUTINE W REFLEX MICROSCOPIC - Abnormal; Notable for the following components:      Result Value   Color, Urine YELLOW (*)    APPearance CLOUDY (*)    Hgb urine dipstick MODERATE (*)    Protein, ur 100 (*)    Leukocytes,Ua LARGE (*)    Bacteria, UA MANY (*)    All other components within normal limits  BASIC METABOLIC PANEL WITH GFR - Abnormal; Notable for  the following components:   Glucose, Bld 105 (*)    All other components within normal limits  CBC - Abnormal; Notable for the following components:   WBC 14.9 (*)    All other components within normal limits  URINE CULTURE  POC URINE PREG, ED     EKG     RADIOLOGY CT abdomen pelvis IV contrast    PROCEDURES:   Procedures  Critical Care:  no Chief Complaint  Patient presents with   Dysuria      MEDICATIONS ORDERED IN ED: Medications  sodium chloride  0.9 % bolus 1,000 mL (0 mLs Intravenous Stopped 08/20/24 1631)  levofloxacin  (LEVAQUIN ) IVPB 500 mg (0 mg Intravenous Stopped 08/20/24 1631)  iohexol  (OMNIPAQUE ) 300 MG/ML solution 100 mL (100 mLs Intravenous Contrast Given 08/20/24 1550)     IMPRESSION / MDM / ASSESSMENT AND PLAN / ED COURSE  I reviewed the triage vital signs and the nursing notes.  Differential diagnosis includes, but is not limited to, UTI, pyelonephritis, kidney stone, infected kidney stone  Patient's presentation is most consistent with acute illness / injury with system symptoms.   UA shows large amount leuks, many bacteria, white blood cell clumps CBC elevated WBC at 14.9, metabolic panel reassuring  Medications given: Levaquin  500 mg IV  Gave the patient Levaquin  IV due to the complicated UTI.  CT abdomen pelvis IV contrast, independent review interpretation by me for pyelonephritis on the left.  I did explain this to the patient.  The patient appears to be stable, no vomiting, does not appear to be septic, therefore I feel patient is stable to try outpatient course of antibiotics.  However patient was given strict instructions to return emergency department if worsening.  Patient is in agreement with treatment plan.  Discharged stable condition.  Discharged with prescription for Levaquin  and Percocet for pain      FINAL CLINICAL IMPRESSION(S) / ED DIAGNOSES   Final diagnoses:  Acute pyelonephritis      Rx / DC Orders   ED Discharge Orders          Ordered    levofloxacin  (LEVAQUIN ) 750 MG tablet  Daily        08/20/24 1651    oxyCODONE -acetaminophen  (PERCOCET) 5-325 MG tablet  Every 4 hours PRN        08/20/24 1651             Note:  This document was prepared using Dragon voice recognition software and may include unintentional dictation errors.    Gasper Devere ORN, PA-C 08/20/24 1654    Waymond Lorelle Cummins, MD 08/21/24 1500

## 2024-08-24 LAB — URINE CULTURE: Culture: 80000 — AB
